# Patient Record
Sex: Female | Born: 2000 | Race: White | Hispanic: No | Marital: Single | Smoking: Never smoker
Health system: Southern US, Community
[De-identification: ages and names within clinical notes are randomized; demographics above are authoritative.]

## PROBLEM LIST (undated history)

## (undated) DIAGNOSIS — J45909 Unspecified asthma, uncomplicated: Secondary | ICD-10-CM

---
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
## 2023-04-14 ENCOUNTER — Ambulatory Visit
Admission: EM | Admit: 2023-04-14 | Discharge: 2023-04-14 | Disposition: A | Discharge status: Home / Self Care | Payer: Commercial Managed Care - PPO

## 2023-04-14 DIAGNOSIS — J069 Acute upper respiratory infection, unspecified: Secondary | ICD-10-CM

## 2023-04-14 HISTORY — DX: Unspecified asthma, uncomplicated: J45.909

## 2023-04-14 MED ORDER — BENZONATATE 100 MG PO CAPS: 100.0000 mg | ORAL_CAPSULE | Freq: Three times a day (TID) | ORAL | 0 refills | Status: AC | PRN

## 2023-04-14 NOTE — Discharge Instructions (Addendum)
Take the Tessalon Perles as directed.  Follow up with your primary care provider if your symptoms are not improving.    

## 2023-04-14 NOTE — ED Provider Notes (Signed)
Jennifer Chang    CSN: [id number] Arrival date & time: 04/14/23  7846      History   Chief Complaint Chief Complaint  Patient presents with   Cough   Fever    HPI Jennifer Chang is a 22 y.o. female.  Patient presents with 1 week history of mild cough and congestion.  Her cough is occasionally productive.  She denies fever, chest pain, shortness of breath, wheezing, or other symptoms.  Previously taking Robitussin but no OTC medications taken today.  She has not needed to use her albuterol inhaler.  Her medical history includes asthma.  The history is provided by the patient and medical records.    Past Medical History:  Diagnosis Date   Asthma     There are no problems to display for this patient.   History reviewed. No pertinent surgical history.  OB History   No obstetric history on file.      Home Medications    Prior to Admission medications   Medication Sig Start Date End Date Taking? Authorizing Provider  albuterol (VENTOLIN HFA) 108 (90 Base) MCG/ACT inhaler SMARTSIG:2 Puff(s) By Mouth Every 4-6 Hours PRN 01/29/23  Yes [provider]  benzonatate (TESSALON) 100 MG capsule Take 1 capsule (100 mg total) by mouth 3 (three) times daily as needed for cough. 04/14/23  Yes Mickie Bail, NP    Family History History reviewed. No pertinent family history.  Social History Social History   Tobacco Use   Smoking status: Never   Smokeless tobacco: Never  Vaping Use   Vaping Use: Never used  Substance Use Topics   Alcohol use: Never   Drug use: Never     Allergies   Patient has no known allergies.   Review of Systems Review of Systems  Constitutional:  Negative for chills and fever.  HENT:  Positive for congestion. Negative for ear pain and sore throat.   Respiratory:  Positive for cough. Negative for shortness of breath and wheezing.   Cardiovascular:  Negative for chest pain and palpitations.  Gastrointestinal:  Negative for diarrhea  and vomiting.     Physical Exam Triage Vital Signs ED Triage Vitals  Enc Vitals Group     BP 04/14/23 0901 121/84     Pulse Rate 04/14/23 0851 73     Resp 04/14/23 0851 18     Temp 04/14/23 0851 98.8 F (37.1 C)     Temp src --      SpO2 04/14/23 0851 96 %     Weight 04/14/23 0859 118 lb (53.5 kg)     Height 04/14/23 0859 5\' 4"  (1.626 m)     Head Circumference --      Peak Flow --      Pain Score 04/14/23 0859 0     Pain Loc --      Pain Edu? --      Excl. in GC? --    No data found.  Updated Vital Signs BP 121/84   Pulse 73   Temp 98.8 F (37.1 C)   Resp 18   Ht 5\' 4"  (1.626 m)   Wt 118 lb (53.5 kg)   LMP 03/18/2023   SpO2 96%   BMI 20.25 kg/m   Visual Acuity Right Eye Distance:   Left Eye Distance:   Bilateral Distance:    Right Eye Near:   Left Eye Near:    Bilateral Near:     Physical Exam Vitals and nursing note  reviewed.  Constitutional:      General: She is not in acute distress.    Appearance: Normal appearance. She is well-developed. She is not ill-appearing.  HENT:     Right Ear: Tympanic membrane normal.     Left Ear: Tympanic membrane normal.     Nose: Nose normal.     Mouth/Throat:     Mouth: Mucous membranes are moist.     Pharynx: Oropharynx is clear.  Cardiovascular:     Rate and Rhythm: Normal rate and regular rhythm.     Heart sounds: Normal heart sounds.  Pulmonary:     Effort: Pulmonary effort is normal. No respiratory distress.     Breath sounds: Normal breath sounds. No wheezing.  Musculoskeletal:     Cervical back: Neck supple.  Skin:    General: Skin is warm and dry.  Neurological:     Mental Status: She is alert.  Psychiatric:        Mood and Affect: Mood normal.        Behavior: Behavior normal.      UC Treatments / Results  Labs (all labs ordered are listed, but only abnormal results are displayed) Labs Reviewed - No data to display  EKG   Radiology No results found.  Procedures Procedures (including  critical care time)  Medications Ordered in UC Medications - No data to display  Initial Impression / Assessment and Plan / UC Course  I have reviewed the triage vital signs and the nursing notes.  Pertinent labs & imaging results that were available during my care of the patient were reviewed by me and considered in my medical decision making (see chart for details).    Viral URI.  Lungs are clear; O2 sat 96%.  Patient declines prednisone today.  She reports her albuterol inhaler is in-date and has plenty of activations.  Treating cough with Tessalon Perles.  Instructed patient to follow up with her PCP if her symptoms are not improving.  She agrees to plan of care.    Final Clinical Impressions(s) / UC Diagnoses   Final diagnoses:  Viral URI     Discharge Instructions      Take the Tessalon Perles as directed.  Follow up with your primary care provider if your symptoms are not improving.        ED Prescriptions     Medication Sig Dispense Auth. Provider   benzonatate (TESSALON) 100 MG capsule Take 1 capsule (100 mg total) by mouth 3 (three) times daily as needed for cough. 21 capsule Mickie Bail, NP      PDMP not reviewed this encounter.   Mickie Bail, NP 04/14/23 [phone]

## 2023-04-14 NOTE — ED Triage Notes (Signed)
Patient to Urgent Care with complaints of intermittently productive cough with yellow/ green mucus. Report feeling like she can't take deep breaths. Hx of asthma, has an albuterol inhaler but has not used it because it makes her cough.   Max temp 99.1 two days ago. Denies any known fevers. Has been taking robitussin.

## 2023-04-15 ENCOUNTER — Institutional Professional Consult (permissible substitution): Payer: Commercial Managed Care - PPO | Admitting: Internal Medicine

## 2023-04-15 ENCOUNTER — Encounter: Payer: Self-pay | Admitting: Internal Medicine

## 2023-04-15 NOTE — Telephone Encounter (Signed)
Called and spoke with pt. Pt states she goes to [facility] clinic and will f/u with them for asthma.

## 2024-04-28 ENCOUNTER — Other Ambulatory Visit: Payer: Self-pay | Admitting: Internal Medicine

## 2024-04-28 DIAGNOSIS — G935 Compression of brain: Secondary | ICD-10-CM

## 2024-07-09 ENCOUNTER — Ambulatory Visit: Admitting: Neurosurgery
